# Patient Record
Sex: Female | Born: 1970 | Race: Black or African American | Hispanic: No | Marital: Married | Smoking: Never smoker
Health system: Southern US, Community
[De-identification: ages and names within clinical notes are randomized; demographics above are authoritative.]

## PROBLEM LIST (undated history)

## (undated) DIAGNOSIS — I10 Essential (primary) hypertension: Secondary | ICD-10-CM

## (undated) DIAGNOSIS — K219 Gastro-esophageal reflux disease without esophagitis: Secondary | ICD-10-CM

## (undated) DIAGNOSIS — K08109 Complete loss of teeth, unspecified cause, unspecified class: Secondary | ICD-10-CM

## (undated) DIAGNOSIS — G4730 Sleep apnea, unspecified: Secondary | ICD-10-CM

## (undated) DIAGNOSIS — M4316 Spondylolisthesis, lumbar region: Secondary | ICD-10-CM

## (undated) DIAGNOSIS — M1990 Unspecified osteoarthritis, unspecified site: Secondary | ICD-10-CM

## (undated) DIAGNOSIS — Z973 Presence of spectacles and contact lenses: Secondary | ICD-10-CM

## (undated) HISTORY — PX: CARPAL TUNNEL RELEASE: SHX101

## (undated) HISTORY — PX: OTHER SURGICAL HISTORY: SHX169

## (undated) HISTORY — PX: TUBAL LIGATION: SHX77

## (undated) HISTORY — PX: MULTIPLE TOOTH EXTRACTIONS: SHX2053

---

## 2012-08-15 ENCOUNTER — Emergency Department (HOSPITAL_COMMUNITY)
Admission: EM | Admit: 2012-08-15 | Discharge: 2012-08-15 | Disposition: A | Discharge status: Home / Self Care | Payer: BC Managed Care – PPO | Attending: Emergency Medicine | Admitting: Emergency Medicine

## 2012-08-15 ENCOUNTER — Emergency Department (HOSPITAL_COMMUNITY): Payer: BC Managed Care – PPO

## 2012-08-15 ENCOUNTER — Encounter (HOSPITAL_COMMUNITY): Payer: Self-pay | Admitting: *Deleted

## 2012-08-15 DIAGNOSIS — I10 Essential (primary) hypertension: Secondary | ICD-10-CM | POA: Insufficient documentation

## 2012-08-15 DIAGNOSIS — J40 Bronchitis, not specified as acute or chronic: Secondary | ICD-10-CM

## 2012-08-15 DIAGNOSIS — Z79899 Other long term (current) drug therapy: Secondary | ICD-10-CM | POA: Insufficient documentation

## 2012-08-15 HISTORY — DX: Essential (primary) hypertension: I10

## 2012-08-15 MED ORDER — PREDNISONE (PAK) 10 MG PO TABS: 10.0000 mg | ORAL_TABLET | Freq: Every day | ORAL | Status: DC

## 2012-08-15 MED ORDER — ALBUTEROL SULFATE HFA 108 (90 BASE) MCG/ACT IN AERS: 2.0000 | INHALATION_SPRAY | RESPIRATORY_TRACT | Status: DC | PRN

## 2012-08-15 MED ORDER — ALBUTEROL SULFATE (5 MG/ML) 0.5% IN NEBU
2.5000 mg | INHALATION_SOLUTION | Freq: Once | RESPIRATORY_TRACT | Status: AC
  Administered 2012-08-15: 2.5 mg via RESPIRATORY_TRACT
  Filled 2012-08-15: qty 0.5

## 2012-08-15 MED ORDER — IPRATROPIUM BROMIDE 0.02 % IN SOLN
0.5000 mg | Freq: Once | RESPIRATORY_TRACT | Status: AC
  Administered 2012-08-15: 0.5 mg via RESPIRATORY_TRACT
  Filled 2012-08-15: qty 2.5

## 2012-08-15 NOTE — ED Notes (Signed)
Cough, feels sob at times.  Seen at Urgent care in [place] and dx with bronchitis and pharngitis,  3/17, started on z pack .  Feels no better.  No fever

## 2012-08-15 NOTE — ED Provider Notes (Signed)
History     CSN: [id number]  Arrival date & time 08/15/12  1046   First MD Initiated Contact with Patient 08/15/12 1124      Chief Complaint  Patient presents with  . Cough    (Consider location/radiation/quality/duration/timing/severity/associated sxs/prior treatment) Patient is a 42 y.o. female presenting with cough. The history is provided by the patient.  Cough Cough characteristics:  Non-productive and hacking Severity:  Moderate Onset quality:  Gradual Smoker: no   Relieved by:  Nothing  Patient went to Urgent Care 08/12/12 and was started on Z-pak and cough medication and had an injection. She continues to have a cough.   Past Medical History  Diagnosis Date  . Hypertension     Past Surgical History  Procedure Laterality Date  . Carpal tunnel release    . Tubal ligation      History reviewed. No pertinent family history.  History  Substance Use Topics  . Smoking status: Never Smoker   . Smokeless tobacco: Not on file  . Alcohol Use: No    OB History   Grav Para Term Preterm Abortions TAB SAB Ect Mult Living                  Review of Systems  Respiratory: Positive for cough.     Allergies  Review of patient's allergies indicates no known allergies.  Home Medications   Current Outpatient Rx  Name  Route  Sig  Dispense  Refill  . metoprolol succinate (TOPROL-XL) 50 MG 24 hr tablet   Oral   Take 50 mg by mouth every morning. Take with or immediately following a meal.         . omeprazole (PRILOSEC) 40 MG capsule   Oral   Take 40 mg by mouth daily.           BP 138/84  Pulse 92  Temp(Src) 98.3 F (36.8 C) (Oral)  Resp 20  Ht 5' (1.524 m)  Wt 194 lb (87.998 kg)  BMI 37.89 kg/m2  SpO2 100%  LMP 07/05/2012  Physical Exam  Nursing note and vitals reviewed. Constitutional: She is oriented to person, place, and time. She appears well-developed and well-nourished. No distress.  HENT:  Head: Normocephalic and atraumatic.  Eyes: EOM  are normal. Pupils are equal, round, and reactive to light.  Neck: Neck supple.  Cardiovascular: Normal rate and regular rhythm.   Pulmonary/Chest: Effort normal.  Decreased breath sounds bilateral lower lungs. Prolonged expirations.  Abdominal: Soft. There is no tenderness.  Musculoskeletal: Normal range of motion. She exhibits no edema.  Neurological: She is alert and oriented to person, place, and time. No cranial nerve deficit.  Skin: Skin is warm and dry.  Psychiatric: She has a normal mood and affect. Her behavior is normal. Judgment and thought content normal.   Dg Chest 2 View  08/15/2012  *RADIOLOGY REPORT*  Clinical Data: Cough.  CHEST - 2 VIEW  Comparison: None.  Findings: Heart is upper normal in size.  Low volumes.  Bibasilar atelectasis.  No consolidation.  No pneumothorax and no pleural effusion.  IMPRESSION: Bibasilar atelectasis.   Original Report Authenticated By: Jolaine Click, M.D.     Assessment: 42 y.o. female with cough   Bronchitis  Plan:  Continue cough medication   Continue Z-pak   Albuterol inhaler   Prednisone taper   Follow up with PCP, return as needed   ED Course  Procedures   MDM  I have reviewed this patient's vital signs, nurses  notes, appropriate imaging.  I have discussed findings with the patient and plan of care. Patient voices understanding.   Medication List    TAKE these medications       albuterol 108 (90 BASE) MCG/ACT inhaler  Commonly known as:  PROVENTIL HFA;VENTOLIN HFA  Inhale 2 puffs into the lungs every 4 (four) hours as needed for wheezing.     predniSONE 10 MG tablet  Commonly known as:  STERAPRED UNI-PAK  Take 1 tablet (10 mg total) by mouth daily. Take 6 tablets today, then 5 tablets tomorrow, then,4,3,2,1      ASK your doctor about these medications       metoprolol succinate 50 MG 24 hr tablet  Commonly known as:  TOPROL-XL  Take 50 mg by mouth every morning. Take with or immediately following a meal.     omeprazole  40 MG capsule  Commonly known as:  PRILOSEC  Take 40 mg by mouth daily.              [place], [place] 08/16/12 [phone]

## 2012-08-19 NOTE — ED Provider Notes (Signed)
Medical screening examination/treatment/procedure(s) were performed by non-physician practitioner and as supervising physician I was immediately available for consultation/collaboration.   Shelda Jakes, MD 08/19/12 [phone]

## 2018-04-11 HISTORY — PX: OTHER SURGICAL HISTORY: SHX169

## 2019-01-21 ENCOUNTER — Other Ambulatory Visit: Payer: Self-pay | Admitting: Neurological Surgery

## 2019-01-30 ENCOUNTER — Other Ambulatory Visit: Payer: Self-pay

## 2019-01-30 ENCOUNTER — Encounter (HOSPITAL_COMMUNITY): Payer: Self-pay | Admitting: *Deleted

## 2019-01-30 NOTE — Progress Notes (Addendum)
Sherry Crane denies chest pain or shortness of breath.  Patient reports that a couple of days ago she had pain in her left arm, patient rates pain a 5-6, it was a sharpe ache. Sherry Crane states that she had not injured her arm,she lied down and it went away.  "You know what they say about left arm pain can be a symptom of heart attack, if it had not gone away I would have called someone." Sherry Crane has a history of HTN, Sleep apnea, she has not had any cardiac studies.  PCP is Sherry Crane at [facility].  Patient has sleep apnea, the Dr who did the test retired and " I am not wearing it now, because I can't get my supplies."  Sherry Crane and daughters and a cousin, tested positive for Covid in August, possibly around the 7th or 8th, they were on quarantine until 01/18/2019. Patient was tested [facility] in [place].    I am requesting records from PCP and [facility]. I am asking anesthesia PA- C to review chart.

## 2019-01-31 ENCOUNTER — Encounter (HOSPITAL_COMMUNITY): Payer: Self-pay | Admitting: Physician Assistant

## 2019-01-31 NOTE — Anesthesia Preprocedure Evaluation (Deleted)
Anesthesia Evaluation    Airway        Dental   Pulmonary           Cardiovascular hypertension,      Neuro/Psych    GI/Hepatic   Endo/Other    Renal/GU      Musculoskeletal   Abdominal   Peds  Hematology   Anesthesia Other Findings   Reproductive/Obstetrics                             Anesthesia Physical Anesthesia Plan  ASA:   Anesthesia Plan:    Post-op Pain Management:    Induction:   PONV Risk Score and Plan:   Airway Management Planned:   Additional Equipment:   Intra-op Plan:   Post-operative Plan:   Informed Consent:   Plan Discussed with:   Anesthesia Plan Comments: (Pt reported to PAT nurse during SDW call that she had recently had throbbing discomfort in her left arm and if the pain continued she would get seen at ED in case it was heart related. Review of records in care everywhere shows she was seen by her PCP Dr. Garwin Brothers Oct 2019 for c/o of chest tightness. EKG was normal and symptoms resolved but per note they would consider cardiology referral if symptoms continued. I called and spoke with Dr. Garwin Brothers and she said the patient was no longer seen in her clinic due to noncompliance. Pt stated she currently has no PCP. I called and spoke with Janett Billow at Dr. Colleen Can office to let her know that pt will at minimum need to be seen and cleared by PCP if she has one and if not she will need to establish with one.)        Anesthesia Quick Evaluation

## 2019-02-01 ENCOUNTER — Other Ambulatory Visit (HOSPITAL_COMMUNITY)
Admission: RE | Admit: 2019-02-01 | Discharge: 2019-02-01 | Disposition: A | Discharge status: Home / Self Care | Payer: BC Managed Care – PPO | Source: Ambulatory Visit | Attending: Neurological Surgery | Admitting: Neurological Surgery

## 2019-02-01 DIAGNOSIS — Z01812 Encounter for preprocedural laboratory examination: Secondary | ICD-10-CM | POA: Diagnosis present

## 2019-02-01 DIAGNOSIS — U071 COVID-19: Secondary | ICD-10-CM | POA: Diagnosis not present

## 2019-02-03 LAB — NOVEL CORONAVIRUS, NAA (HOSP ORDER, SEND-OUT TO REF LAB; TAT 18-24 HRS): SARS-CoV-2, NAA: DETECTED — AB

## 2019-02-03 NOTE — Progress Notes (Signed)
Patient has positive covid test on 02/01/19.  Dr Zada Finders called and informed of positive test results.  MD to inform patient.

## 2019-02-04 ENCOUNTER — Other Ambulatory Visit: Payer: Self-pay | Admitting: Neurological Surgery

## 2019-02-05 ENCOUNTER — Inpatient Hospital Stay (HOSPITAL_COMMUNITY)
Admission: RE | Admit: 2019-02-05 | Payer: BC Managed Care – PPO | Source: Home / Self Care | Admitting: Neurological Surgery

## 2019-02-05 HISTORY — DX: Unspecified osteoarthritis, unspecified site: M19.90

## 2019-02-05 HISTORY — DX: Gastro-esophageal reflux disease without esophagitis: K21.9

## 2019-02-05 SURGERY — TRANSFORAMINAL LUMBAR INTERBODY FUSION (TLIF) WITH PEDICLE SCREW FIXATION 1 LEVEL
Anesthesia: General

## 2019-02-13 ENCOUNTER — Encounter (HOSPITAL_COMMUNITY): Payer: Self-pay

## 2019-02-13 ENCOUNTER — Encounter (HOSPITAL_COMMUNITY)
Admission: RE | Admit: 2019-02-13 | Discharge: 2019-02-13 | Disposition: A | Discharge status: Home / Self Care | Payer: BC Managed Care – PPO | Source: Ambulatory Visit | Attending: Neurological Surgery | Admitting: Neurological Surgery

## 2019-02-13 ENCOUNTER — Other Ambulatory Visit: Payer: Self-pay

## 2019-02-13 DIAGNOSIS — R9431 Abnormal electrocardiogram [ECG] [EKG]: Secondary | ICD-10-CM | POA: Diagnosis not present

## 2019-02-13 DIAGNOSIS — Z0184 Encounter for antibody response examination: Secondary | ICD-10-CM | POA: Insufficient documentation

## 2019-02-13 DIAGNOSIS — Z01812 Encounter for preprocedural laboratory examination: Secondary | ICD-10-CM | POA: Insufficient documentation

## 2019-02-13 DIAGNOSIS — I10 Essential (primary) hypertension: Secondary | ICD-10-CM | POA: Diagnosis not present

## 2019-02-13 HISTORY — DX: Spondylolisthesis, lumbar region: M43.16

## 2019-02-13 HISTORY — DX: Presence of spectacles and contact lenses: Z97.3

## 2019-02-13 HISTORY — DX: Sleep apnea, unspecified: G47.30

## 2019-02-13 HISTORY — DX: Complete loss of teeth, unspecified cause, unspecified class: K08.109

## 2019-02-13 LAB — BASIC METABOLIC PANEL
Anion gap: 10 (ref 5–15)
BUN: 8 mg/dL (ref 6–20)
CO2: 23 mmol/L (ref 22–32)
Calcium: 9.1 mg/dL (ref 8.9–10.3)
Chloride: 106 mmol/L (ref 98–111)
Creatinine, Ser: 0.82 mg/dL (ref 0.44–1.00)
GFR calc Af Amer: >60 mL/min (ref >60)
GFR calc non Af Amer: >60 mL/min (ref >60)
Glucose, Bld: 100 mg/dL — ABNORMAL HIGH (ref 70–99)
Potassium: 3.8 mmol/L (ref 3.5–5.1)
Sodium: 139 mmol/L (ref 135–145)

## 2019-02-13 LAB — CBC
HCT: 44.3 % (ref 36.0–46.0)
Hemoglobin: 15 g/dL (ref 12.0–15.0)
MCH: 33.7 pg (ref 26.0–34.0)
MCHC: 33.9 g/dL (ref 30.0–36.0)
MCV: 99.6 fL (ref 80.0–100.0)
Platelets: 225 10*3/uL (ref 150–400)
RBC: 4.45 MIL/uL (ref 3.87–5.11)
RDW: 12 % (ref 11.5–15.5)
WBC: 7.1 10*3/uL (ref 4.0–10.5)
nRBC: 0 % (ref 0.0–0.2)

## 2019-02-13 LAB — TYPE AND SCREEN
ABO/RH(D): O POS
Antibody Screen: NEGATIVE

## 2019-02-13 LAB — SURGICAL PCR SCREEN
MRSA, PCR: NEGATIVE
Staphylococcus aureus: NEGATIVE

## 2019-02-13 LAB — ABO/RH: ABO/RH(D): O POS

## 2019-02-13 NOTE — Progress Notes (Signed)
Pt denies SOB, chest pain, and being under the care of a cardiologist. Pt PCP is Dr. Nicanor Bake. Pt denies having a stress test, echo and cardiac cath. Pt denies having an EKG and chest x ray in the last year. Pt denies recent labs. Pt verbalized understanding of all pre-op instructions.

## 2019-02-13 NOTE — Pre-Procedure Instructions (Signed)
   Sherry Crane  02/13/2019     [id number] [place] [address] [place] [address] Phone: [phone] Fax: [phone]   Your procedure is scheduled on Wednesday, February 19, 2019  Report to [facility] Admitting at 10:30 A.M.  Call this number if you have problems the morning of surgery:  [phone]   Remember: Brush your teeth the morning of surgery with your regular toothpaste.  Do not eat or drink after midnight Tuesday, February 18, 2019   Take these medicines the morning of surgery with A SIP OF WATER : amLODipine (NORVASC),   metoprolol succinate (TOPROL-XL), omeprazole (PRILOSEC)  If needed: traMADol (ULTRAM) for pain. Stop taking Aspirin (unless otherwise advised by surgeon), vitamins, fish oil and herbal medications. Do not take any NSAIDs ie: Ibuprofen, Advil, Naproxen (Aleve), Motrin, BC and Goody Powder; stop now.   Do not wear jewelry, make-up or nail polish.  Do not wear lotions, powders, or perfumes, or deodorant.  Do not shave 48 hours prior to surgery.   Do not bring valuables to the hospital.  [facility] is not responsible for any belongings or valuables.  Contacts, dentures or bridgework may not be worn into surgery.  Leave your suitcase in the car.  After surgery it may be brought to your room. Patients discharged the day of surgery will not be allowed to drive home.  Special instructions: See "  [facility] Preparing For Surgery " sheet.  Please read over the following fact sheets that you were given. Pain Booklet, Coughing and Deep Breathing, Total Joint Packet, MRSA Information and Surgical Site Infection Prevention

## 2019-02-19 ENCOUNTER — Encounter (HOSPITAL_COMMUNITY)
Admission: RE | Disposition: A | Discharge status: Home Health | Payer: Self-pay | Source: Home / Self Care | Attending: Neurological Surgery

## 2019-02-19 ENCOUNTER — Inpatient Hospital Stay (HOSPITAL_COMMUNITY)
Admission: RE | Admit: 2019-02-19 | Discharge: 2019-02-20 | DRG: 460 | Disposition: A | Discharge status: Home Health | Payer: BC Managed Care – PPO | Attending: Neurological Surgery | Admitting: Neurological Surgery

## 2019-02-19 ENCOUNTER — Other Ambulatory Visit: Payer: Self-pay

## 2019-02-19 ENCOUNTER — Inpatient Hospital Stay (HOSPITAL_COMMUNITY): Payer: BC Managed Care – PPO | Admitting: Certified Registered"

## 2019-02-19 ENCOUNTER — Encounter (HOSPITAL_COMMUNITY): Payer: Self-pay

## 2019-02-19 ENCOUNTER — Inpatient Hospital Stay (HOSPITAL_COMMUNITY): Payer: BC Managed Care – PPO

## 2019-02-19 DIAGNOSIS — Z833 Family history of diabetes mellitus: Secondary | ICD-10-CM

## 2019-02-19 DIAGNOSIS — M5416 Radiculopathy, lumbar region: Principal | ICD-10-CM | POA: Diagnosis present

## 2019-02-19 DIAGNOSIS — K219 Gastro-esophageal reflux disease without esophagitis: Secondary | ICD-10-CM | POA: Diagnosis present

## 2019-02-19 DIAGNOSIS — Z8249 Family history of ischemic heart disease and other diseases of the circulatory system: Secondary | ICD-10-CM

## 2019-02-19 DIAGNOSIS — Z20828 Contact with and (suspected) exposure to other viral communicable diseases: Secondary | ICD-10-CM | POA: Diagnosis present

## 2019-02-19 DIAGNOSIS — M4316 Spondylolisthesis, lumbar region: Secondary | ICD-10-CM | POA: Diagnosis present

## 2019-02-19 DIAGNOSIS — I10 Essential (primary) hypertension: Secondary | ICD-10-CM | POA: Diagnosis present

## 2019-02-19 DIAGNOSIS — G4730 Sleep apnea, unspecified: Secondary | ICD-10-CM | POA: Diagnosis present

## 2019-02-19 DIAGNOSIS — M4807 Spinal stenosis, lumbosacral region: Secondary | ICD-10-CM | POA: Diagnosis present

## 2019-02-19 DIAGNOSIS — Z419 Encounter for procedure for purposes other than remedying health state, unspecified: Secondary | ICD-10-CM

## 2019-02-19 HISTORY — PX: TRANSFORAMINAL LUMBAR INTERBODY FUSION (TLIF) WITH PEDICLE SCREW FIXATION 1 LEVEL: SHX6141

## 2019-02-19 LAB — GLUCOSE, CAPILLARY: Glucose-Capillary: 171 mg/dL — ABNORMAL HIGH (ref 70–99)

## 2019-02-19 LAB — POCT PREGNANCY, URINE: Preg Test, Ur: NEGATIVE

## 2019-02-19 SURGERY — TRANSFORAMINAL LUMBAR INTERBODY FUSION (TLIF) WITH PEDICLE SCREW FIXATION 1 LEVEL
Anesthesia: General | Site: Spine Lumbar

## 2019-02-19 MED ORDER — CHLORHEXIDINE GLUCONATE CLOTH 2 % EX PADS: 6.0000 | MEDICATED_PAD | Freq: Once | CUTANEOUS | Status: DC

## 2019-02-19 MED ORDER — METOPROLOL SUCCINATE ER 25 MG PO TB24
50.0000 mg | ORAL_TABLET | Freq: Every morning | ORAL | Status: DC
  Administered 2019-02-20: 09:00:00 50 mg via ORAL
  Filled 2019-02-19: qty 2

## 2019-02-19 MED ORDER — CEFAZOLIN SODIUM-DEXTROSE 2-4 GM/100ML-% IV SOLN
2.0000 g | INTRAVENOUS | Status: AC
  Administered 2019-02-19 (×2): 2 g via INTRAVENOUS
  Filled 2019-02-19: qty 100

## 2019-02-19 MED ORDER — LIDOCAINE-EPINEPHRINE 1 %-1:100000 IJ SOLN
INTRAMUSCULAR | Status: AC
  Filled 2019-02-19: qty 1

## 2019-02-19 MED ORDER — ACETAMINOPHEN 650 MG RE SUPP: 650.0000 mg | RECTAL | Status: DC | PRN

## 2019-02-19 MED ORDER — PROPOFOL 10 MG/ML IV BOLUS
INTRAVENOUS | Status: DC | PRN
  Administered 2019-02-19: 190 mg via INTRAVENOUS

## 2019-02-19 MED ORDER — OXYCODONE HCL 5 MG PO TABS: 5.0000 mg | ORAL_TABLET | Freq: Once | ORAL | Status: DC | PRN

## 2019-02-19 MED ORDER — BUPROPION HCL ER (XL) 150 MG PO TB24
150.0000 mg | ORAL_TABLET | Freq: Every day | ORAL | Status: DC
  Administered 2019-02-19: 21:00:00 150 mg via ORAL
  Filled 2019-02-19: qty 1

## 2019-02-19 MED ORDER — DEXAMETHASONE SODIUM PHOSPHATE 10 MG/ML IJ SOLN
INTRAMUSCULAR | Status: DC | PRN
  Administered 2019-02-19: 10 mg via INTRAVENOUS

## 2019-02-19 MED ORDER — DOCUSATE SODIUM 100 MG PO CAPS
100.0000 mg | ORAL_CAPSULE | Freq: Two times a day (BID) | ORAL | Status: DC
  Administered 2019-02-19 – 2019-02-20 (×2): 100 mg via ORAL
  Filled 2019-02-19 (×2): qty 1

## 2019-02-19 MED ORDER — ONDANSETRON HCL 4 MG/2ML IJ SOLN
INTRAMUSCULAR | Status: DC | PRN
  Administered 2019-02-19: 4 mg via INTRAVENOUS

## 2019-02-19 MED ORDER — ONDANSETRON HCL 4 MG/2ML IJ SOLN
INTRAMUSCULAR | Status: AC
  Filled 2019-02-19: qty 2

## 2019-02-19 MED ORDER — PROPOFOL 10 MG/ML IV BOLUS
INTRAVENOUS | Status: AC
  Filled 2019-02-19: qty 20

## 2019-02-19 MED ORDER — HYDROMORPHONE HCL 1 MG/ML IJ SOLN: 0.5000 mg | INTRAMUSCULAR | Status: DC | PRN

## 2019-02-19 MED ORDER — PANTOPRAZOLE SODIUM 40 MG PO TBEC
40.0000 mg | DELAYED_RELEASE_TABLET | Freq: Every evening | ORAL | Status: DC
  Administered 2019-02-19: 40 mg via ORAL
  Filled 2019-02-19: qty 1

## 2019-02-19 MED ORDER — POLYETHYLENE GLYCOL 3350 17 G PO PACK: 17.0000 g | PACK | Freq: Every day | ORAL | Status: DC | PRN

## 2019-02-19 MED ORDER — EPHEDRINE 5 MG/ML INJ
INTRAVENOUS | Status: AC
  Filled 2019-02-19: qty 10

## 2019-02-19 MED ORDER — THROMBIN 5000 UNITS EX SOLR
CUTANEOUS | Status: AC
  Filled 2019-02-19: qty 5000

## 2019-02-19 MED ORDER — ACETAMINOPHEN 325 MG PO TABS
650.0000 mg | ORAL_TABLET | ORAL | Status: DC | PRN
  Administered 2019-02-19 – 2019-02-20 (×2): 650 mg via ORAL
  Filled 2019-02-19 (×2): qty 2

## 2019-02-19 MED ORDER — ONDANSETRON HCL 4 MG/2ML IJ SOLN: 4.0000 mg | Freq: Four times a day (QID) | INTRAMUSCULAR | Status: DC | PRN

## 2019-02-19 MED ORDER — ONDANSETRON HCL 4 MG/2ML IJ SOLN
4.0000 mg | Freq: Four times a day (QID) | INTRAMUSCULAR | Status: DC | PRN
  Administered 2019-02-19: 21:00:00 4 mg via INTRAVENOUS
  Filled 2019-02-19: qty 2

## 2019-02-19 MED ORDER — OXYCODONE HCL 5 MG PO TABS
10.0000 mg | ORAL_TABLET | ORAL | Status: DC | PRN
  Administered 2019-02-20: 10 mg via ORAL
  Filled 2019-02-19: qty 2

## 2019-02-19 MED ORDER — 0.9 % SODIUM CHLORIDE (POUR BTL) OPTIME
TOPICAL | Status: DC | PRN
  Administered 2019-02-19: 1000 mL

## 2019-02-19 MED ORDER — SODIUM CHLORIDE 0.9% FLUSH: 3.0000 mL | INTRAVENOUS | Status: DC | PRN

## 2019-02-19 MED ORDER — PHENOL 1.4 % MT LIQD: 1.0000 | OROMUCOSAL | Status: DC | PRN

## 2019-02-19 MED ORDER — FENTANYL CITRATE (PF) 100 MCG/2ML IJ SOLN
INTRAMUSCULAR | Status: DC | PRN
  Administered 2019-02-19: 150 ug via INTRAVENOUS
  Administered 2019-02-19: 100 ug via INTRAVENOUS
  Administered 2019-02-19 (×4): 50 ug via INTRAVENOUS
  Administered 2019-02-19: 100 ug via INTRAVENOUS

## 2019-02-19 MED ORDER — LIDOCAINE 2% (20 MG/ML) 5 ML SYRINGE
INTRAMUSCULAR | Status: AC
  Filled 2019-02-19: qty 5

## 2019-02-19 MED ORDER — LACTATED RINGERS IV SOLN
INTRAVENOUS | Status: DC
  Administered 2019-02-19 (×3): via INTRAVENOUS

## 2019-02-19 MED ORDER — LIDOCAINE-EPINEPHRINE 1 %-1:100000 IJ SOLN
INTRAMUSCULAR | Status: DC | PRN
  Administered 2019-02-19: 3.5 mL

## 2019-02-19 MED ORDER — OXYCODONE HCL 5 MG/5ML PO SOLN: 5.0000 mg | Freq: Once | ORAL | Status: DC | PRN

## 2019-02-19 MED ORDER — ROCURONIUM BROMIDE 10 MG/ML (PF) SYRINGE
PREFILLED_SYRINGE | INTRAVENOUS | Status: AC
  Filled 2019-02-19: qty 10

## 2019-02-19 MED ORDER — SODIUM CHLORIDE 0.9 % IV SOLN: 250.0000 mL | INTRAVENOUS | Status: DC

## 2019-02-19 MED ORDER — MIDAZOLAM HCL 5 MG/5ML IJ SOLN
INTRAMUSCULAR | Status: DC | PRN
  Administered 2019-02-19: 2 mg via INTRAVENOUS

## 2019-02-19 MED ORDER — BUPIVACAINE HCL (PF) 0.5 % IJ SOLN
INTRAMUSCULAR | Status: AC
  Filled 2019-02-19: qty 30

## 2019-02-19 MED ORDER — PHENYLEPHRINE 40 MCG/ML (10ML) SYRINGE FOR IV PUSH (FOR BLOOD PRESSURE SUPPORT)
PREFILLED_SYRINGE | INTRAVENOUS | Status: DC | PRN
  Administered 2019-02-19: 80 ug via INTRAVENOUS
  Administered 2019-02-19: 160 ug via INTRAVENOUS

## 2019-02-19 MED ORDER — FENTANYL CITRATE (PF) 250 MCG/5ML IJ SOLN
INTRAMUSCULAR | Status: AC
  Filled 2019-02-19: qty 5

## 2019-02-19 MED ORDER — LIDOCAINE 2% (20 MG/ML) 5 ML SYRINGE
INTRAMUSCULAR | Status: DC | PRN
  Administered 2019-02-19: 60 mg via INTRAVENOUS

## 2019-02-19 MED ORDER — ROCURONIUM BROMIDE 50 MG/5ML IV SOSY
PREFILLED_SYRINGE | INTRAVENOUS | Status: DC | PRN
  Administered 2019-02-19 (×2): 50 mg via INTRAVENOUS

## 2019-02-19 MED ORDER — CEFAZOLIN SODIUM 1 G IJ SOLR
INTRAMUSCULAR | Status: AC
  Filled 2019-02-19: qty 20

## 2019-02-19 MED ORDER — AMLODIPINE BESYLATE 5 MG PO TABS
5.0000 mg | ORAL_TABLET | Freq: Every day | ORAL | Status: DC
  Administered 2019-02-20: 09:00:00 5 mg via ORAL
  Filled 2019-02-19: qty 1

## 2019-02-19 MED ORDER — SODIUM CHLORIDE 0.9% FLUSH
3.0000 mL | Freq: Two times a day (BID) | INTRAVENOUS | Status: DC
  Administered 2019-02-19 – 2019-02-20 (×2): 3 mL via INTRAVENOUS

## 2019-02-19 MED ORDER — THROMBIN 5000 UNITS EX SOLR
OROMUCOSAL | Status: DC | PRN
  Administered 2019-02-19: 5 mL via TOPICAL

## 2019-02-19 MED ORDER — SUGAMMADEX SODIUM 200 MG/2ML IV SOLN
INTRAVENOUS | Status: DC | PRN
  Administered 2019-02-19: 200 mg via INTRAVENOUS

## 2019-02-19 MED ORDER — CEFAZOLIN SODIUM-DEXTROSE 2-4 GM/100ML-% IV SOLN
2.0000 g | Freq: Three times a day (TID) | INTRAVENOUS | Status: AC
  Administered 2019-02-20 (×2): 2 g via INTRAVENOUS
  Filled 2019-02-19 (×2): qty 100

## 2019-02-19 MED ORDER — BUPIVACAINE HCL (PF) 0.5 % IJ SOLN
INTRAMUSCULAR | Status: DC | PRN
  Administered 2019-02-19: 3.5 mL

## 2019-02-19 MED ORDER — ONDANSETRON HCL 4 MG PO TABS: 4.0000 mg | ORAL_TABLET | Freq: Four times a day (QID) | ORAL | Status: DC | PRN

## 2019-02-19 MED ORDER — DEXAMETHASONE SODIUM PHOSPHATE 10 MG/ML IJ SOLN
INTRAMUSCULAR | Status: AC
  Filled 2019-02-19: qty 1

## 2019-02-19 MED ORDER — SODIUM CHLORIDE 0.9 % IV SOLN
INTRAVENOUS | Status: DC | PRN
  Administered 2019-02-19: 50 ug/min via INTRAVENOUS

## 2019-02-19 MED ORDER — OXYCODONE HCL 5 MG PO TABS
5.0000 mg | ORAL_TABLET | ORAL | Status: DC | PRN
  Administered 2019-02-20 (×2): 5 mg via ORAL
  Filled 2019-02-19 (×2): qty 1

## 2019-02-19 MED ORDER — MENTHOL 3 MG MT LOZG: 1.0000 | LOZENGE | OROMUCOSAL | Status: DC | PRN

## 2019-02-19 MED ORDER — FENTANYL CITRATE (PF) 100 MCG/2ML IJ SOLN: 25.0000 ug | INTRAMUSCULAR | Status: DC | PRN

## 2019-02-19 MED ORDER — MIDAZOLAM HCL 2 MG/2ML IJ SOLN
INTRAMUSCULAR | Status: AC
  Filled 2019-02-19: qty 2

## 2019-02-19 MED ORDER — PHENYLEPHRINE 40 MCG/ML (10ML) SYRINGE FOR IV PUSH (FOR BLOOD PRESSURE SUPPORT)
PREFILLED_SYRINGE | INTRAVENOUS | Status: AC
  Filled 2019-02-19: qty 10

## 2019-02-19 MED ORDER — SODIUM CHLORIDE 0.9 % IV SOLN
INTRAVENOUS | Status: DC | PRN
  Administered 2019-02-19: 14:00:00 500 mL

## 2019-02-19 SURGICAL SUPPLY — 69 items
BAG DECANTER FOR FLEXI CONT (MISCELLANEOUS) ×3 IMPLANT
BAND RUBBER #18 3X1/16 STRL (MISCELLANEOUS) IMPLANT
BASKET BONE COLLECTION (BASKET) ×3 IMPLANT
BLADE SURG 11 STRL SS (BLADE) ×3 IMPLANT
BUR MATCHSTICK NEURO 3.0 LAGG (BURR) ×3 IMPLANT
BUR PRECISION FLUTE 5.0 (BURR) ×3 IMPLANT
CANISTER SUCT 3000ML PPV (MISCELLANEOUS) ×3 IMPLANT
CONT SPEC 4OZ CLIKSEAL STRL BL (MISCELLANEOUS) ×3 IMPLANT
COVER BACK TABLE 60X90IN (DRAPES) ×3 IMPLANT
DECANTER SPIKE VIAL GLASS SM (MISCELLANEOUS) ×3 IMPLANT
DERMABOND ADVANCED (GAUZE/BANDAGES/DRESSINGS) ×2
DERMABOND ADVANCED .7 DNX12 (GAUZE/BANDAGES/DRESSINGS) ×1 IMPLANT
DEVICE INTERBODY ELEVATE 23X7 (Cage) ×3 IMPLANT
DRAPE C-ARM 42X72 X-RAY (DRAPES) ×3 IMPLANT
DRAPE C-ARMOR (DRAPES) ×3 IMPLANT
DRAPE LAPAROTOMY 100X72X124 (DRAPES) ×3 IMPLANT
DRAPE MICROSCOPE LEICA (MISCELLANEOUS) IMPLANT
DRAPE SURG 17X23 STRL (DRAPES) IMPLANT
DRSG OPSITE POSTOP 4X6 (GAUZE/BANDAGES/DRESSINGS) ×3 IMPLANT
DURAPREP 26ML APPLICATOR (WOUND CARE) ×3 IMPLANT
ELECT BLADE 4.0 EZ CLEAN MEGAD (MISCELLANEOUS) ×3
ELECT REM PT RETURN 9FT ADLT (ELECTROSURGICAL) ×3
ELECTRODE BLDE 4.0 EZ CLN MEGD (MISCELLANEOUS) ×1 IMPLANT
ELECTRODE REM PT RTRN 9FT ADLT (ELECTROSURGICAL) ×1 IMPLANT
GAUZE 4X4 16PLY RFD (DISPOSABLE) IMPLANT
GAUZE SPONGE 4X4 12PLY STRL (GAUZE/BANDAGES/DRESSINGS) IMPLANT
GLOVE BIO SURGEON STRL SZ7 (GLOVE) ×6 IMPLANT
GLOVE BIO SURGEON STRL SZ7.5 (GLOVE) ×6 IMPLANT
GLOVE BIOGEL PI IND STRL 7.5 (GLOVE) ×2 IMPLANT
GLOVE BIOGEL PI IND STRL 8 (GLOVE) ×4 IMPLANT
GLOVE BIOGEL PI INDICATOR 7.5 (GLOVE) ×4
GLOVE BIOGEL PI INDICATOR 8 (GLOVE) ×8
GLOVE ECLIPSE 7.5 STRL STRAW (GLOVE) ×9 IMPLANT
GLOVE EXAM NITRILE LRG STRL (GLOVE) IMPLANT
GLOVE EXAM NITRILE XL STR (GLOVE) IMPLANT
GLOVE EXAM NITRILE XS STR PU (GLOVE) IMPLANT
GOWN STRL REUS W/ TWL LRG LVL3 (GOWN DISPOSABLE) ×2 IMPLANT
GOWN STRL REUS W/ TWL XL LVL3 (GOWN DISPOSABLE) IMPLANT
GOWN STRL REUS W/TWL 2XL LVL3 (GOWN DISPOSABLE) ×6 IMPLANT
GOWN STRL REUS W/TWL LRG LVL3 (GOWN DISPOSABLE) ×4
GOWN STRL REUS W/TWL XL LVL3 (GOWN DISPOSABLE)
HEMOSTAT POWDER KIT SURGIFOAM (HEMOSTASIS) ×3 IMPLANT
KIT BASIN OR (CUSTOM PROCEDURE TRAY) ×3 IMPLANT
KIT POSITION SURG JACKSON T1 (MISCELLANEOUS) ×3 IMPLANT
KIT TURNOVER KIT B (KITS) ×3 IMPLANT
MILL MEDIUM DISP (BLADE) IMPLANT
NEEDLE HYPO 18GX1.5 BLUNT FILL (NEEDLE) IMPLANT
NEEDLE HYPO 22GX1.5 SAFETY (NEEDLE) ×3 IMPLANT
NEEDLE SPNL 18GX3.5 QUINCKE PK (NEEDLE) IMPLANT
NS IRRIG 1000ML POUR BTL (IV SOLUTION) ×3 IMPLANT
PACK LAMINECTOMY NEURO (CUSTOM PROCEDURE TRAY) ×3 IMPLANT
PAD ARMBOARD 7.5X6 YLW CONV (MISCELLANEOUS) ×9 IMPLANT
ROD SPINAL 5.5X35 CP 4 TI (Rod) ×6 IMPLANT
SCREW SET SOLERA (Screw) ×8 IMPLANT
SCREW SET SOLERA TI5.5 (Screw) ×4 IMPLANT
SCREW SOLERA 30X6.5XMA NS SPNE (Screw) ×2 IMPLANT
SCREW SOLERA 6.5X30MM (Screw) ×4 IMPLANT
SCREW SOLERA 6.5X35MM (Screw) ×6 IMPLANT
SPONGE LAP 4X18 RFD (DISPOSABLE) IMPLANT
SPONGE SURGIFOAM ABS GEL 100 (HEMOSTASIS) IMPLANT
SUT MNCRL AB 3-0 PS2 18 (SUTURE) ×3 IMPLANT
SUT VIC AB 0 CT1 18XCR BRD8 (SUTURE) ×1 IMPLANT
SUT VIC AB 0 CT1 8-18 (SUTURE) ×2
SUT VIC AB 2-0 CP2 18 (SUTURE) ×6 IMPLANT
SYR 3ML LL SCALE MARK (SYRINGE) IMPLANT
TOWEL GREEN STERILE (TOWEL DISPOSABLE) ×3 IMPLANT
TOWEL GREEN STERILE FF (TOWEL DISPOSABLE) ×3 IMPLANT
TRAY FOLEY MTR SLVR 16FR STAT (SET/KITS/TRAYS/PACK) ×3 IMPLANT
WATER STERILE IRR 1000ML POUR (IV SOLUTION) ×3 IMPLANT

## 2019-02-19 NOTE — Op Note (Addendum)
PATIENT: Sherry Crane  DAY OF SURGERY: 02/19/19   PRE-OPERATIVE DIAGNOSIS:  Lumbar spondylolisthesis, lumbar radiculopathy   POST-OPERATIVE DIAGNOSIS:  Lumbar spondylolisthesis, lumbar radiculopathy   PROCEDURE:  L5-S1 bilateral facetectomies, left L5-S1 TLIF, bilateral L5-S1 pedicle screw placement with posterolateral fusion   SURGEON:  Surgeon(s) and Role:    Judith Part, MD - Primary     ANESTHESIA: ETGA   BRIEF HISTORY: This is a 48 year old woman who presented with left greater than right lower extremity radicular pain and low back pain. The patient was found to have a mobile spondylolisthesis with left greater than right foraminal stenosis at L5-S1. This was discussed with the patient as well as risks, benefits, and alternatives and wished to proceed with surgery.   OPERATIVE DETAIL: The patient was taken to the operating room and anesthesia was induced by the anesthesia team. They were placed on the OR table in the prone position with padding of all pressure points. A formal time out was performed with two patient identifiers and confirmed the operative site. The operative site was marked, hair was clipped with surgical clippers, the area was then prepped and draped in a sterile fashion. Fluoro was used to localize the operative level and a midline incision was placed to expose from L5 to S1. Subperiosteal dissection was performed bilaterally and fluoroscopy was again used to confirm the surgical level.   Decompression was performed, which consisted of bilateral facetectomies and foraminotomies at L5-S1. The bone was saved during decompression, morselized, and used as autograft in the fusion procedure. A TLIF was then performed on the left at L5-S1. The exiting and traversing nerve roots were identified and protected along with the thecal sac. An annulotomy was created and a discectomy was performed using a combination of rongeurs, shavers, and curettes. After disc removal and  endplate preparation, bone graft was inserted into the disc space with a funnel and an expandable PEEK interbody cage (Medtronic) was inserted using fluoroscopic guidance. It was expanded and final location was confirmed.   Pedicle instrumentation was then performed. Fluoroscopy was used to guide placement of bilateral pedicle screws (Medtronic) at L5 and S1. These were placed by localizing the pedicle with standard landmarks, drilling a pilot hole, cannulating the pedicle with an awl, palpating for pedicle wall breaches, tapping, palpating again, and then placing the screw. These were connected with rods bilaterally and final tightened according to manufacturer torque specifications. The bone was thoroughly decorticated over the fusion surface and the previously resected bone fragments were morselized and used as autograft.   All instrument and sponge counts were correct, the incision was then closed in layers. The patient was then returned to anesthesia for emergence. No apparent complications at the completion of the procedure.   EBL:  329mL   DRAINS: none   SPECIMENS: none   Judith Part, MD 02/19/19 1:01 PM

## 2019-02-19 NOTE — Transfer of Care (Signed)
Immediate Anesthesia Transfer of Care Note  Patient: Sherry Crane  Procedure(s) Performed: Lumbar Five Sacral One Open transforaminal lumbar interbody fusion with bilateral lumbar Five sacral Onepedicle screw placement. (N/A Spine Lumbar)  Patient Location: PACU  Anesthesia Type:General  Level of Consciousness: drowsy  Airway & Oxygen Therapy: Patient Spontanous Breathing and Patient connected to face mask oxygen  Post-op Assessment: Report given to RN, Post -op Vital signs reviewed and stable and Patient moving all extremities  Post vital signs: Reviewed and stable  Last Vitals:  Vitals Value Taken Time  BP 123/91 02/19/19 1848  Temp 36.7 C 02/19/19 1848  Pulse 91 02/19/19 1901  Resp 20 02/19/19 1901  SpO2 100 % 02/19/19 1901  Vitals shown include unvalidated device data.  Last Pain:  Vitals:   02/19/19 1848  TempSrc:   PainSc: Asleep         Complications: No apparent anesthesia complications

## 2019-02-19 NOTE — Brief Op Note (Signed)
02/19/2019  6:21 PM  PATIENT:  Sherry Crane  49 y.o. female  PRE-OPERATIVE DIAGNOSIS:  Spondylolisthesis, Lumbar region  POST-OPERATIVE DIAGNOSIS:  Spondylolisthesis, Lumbar region.  PROCEDURE:  Procedure(s) with comments: Lumbar Five Sacral One Open transforaminal lumbar interbody fusion with bilateral lumbar Five sacral Onepedicle screw placement. (N/A) - posterior  SURGEON:  Surgeon(s) and Role:    * Barbar Brede, Joyice Faster, MD - Primary  PHYSICIAN ASSISTANT:   ASSISTANTS: none   ANESTHESIA:   general  EBL:  500 mL   BLOOD ADMINISTERED:none  DRAINS: none   LOCAL MEDICATIONS USED:  LIDOCAINE   SPECIMEN:  No Specimen  DISPOSITION OF SPECIMEN:  N/A  COUNTS:  YES  TOURNIQUET:  * No tourniquets in log *  DICTATION: .Note written in EPIC  PLAN OF CARE: Admit to inpatient   PATIENT DISPOSITION:  PACU - hemodynamically stable.   Delay start of Pharmacological VTE agent (>24hrs) due to surgical blood loss or risk of bleeding: yes

## 2019-02-19 NOTE — Anesthesia Procedure Notes (Signed)
Procedure Name: Intubation Date/Time: 02/19/2019 1:09 PM Performed by: Lance Coon, CRNA Pre-anesthesia Checklist: Patient identified, Emergency Drugs available, Suction available, Patient being monitored and Timeout performed Patient Re-evaluated:Patient Re-evaluated prior to induction Oxygen Delivery Method: Circle system utilized Preoxygenation: Pre-oxygenation with 100% oxygen Induction Type: IV induction Ventilation: Mask ventilation without difficulty Laryngoscope Size: Miller and 2 Grade View: Grade I Tube type: Oral Tube size: 7.0 mm Number of attempts: 1 Airway Equipment and Method: Stylet Placement Confirmation: ETT inserted through vocal cords under direct vision,  positive ETCO2 and breath sounds checked- equal and bilateral Secured at: 21 cm Tube secured with: Tape Dental Injury: Teeth and Oropharynx as per pre-operative assessment

## 2019-02-19 NOTE — Anesthesia Preprocedure Evaluation (Signed)
Anesthesia Evaluation  Patient identified by MRN, date of birth, ID band Patient awake    Reviewed: Allergy & Precautions, H&P , NPO status , Patient's Chart, lab work & pertinent test results  Airway Mallampati: II   Neck ROM: full    Dental   Pulmonary sleep apnea ,    breath sounds clear to auscultation       Cardiovascular hypertension,  Rhythm:regular Rate:Normal     Neuro/Psych    GI/Hepatic GERD  ,  Endo/Other    Renal/GU      Musculoskeletal  (+) Arthritis ,   Abdominal   Peds  Hematology   Anesthesia Other Findings   Reproductive/Obstetrics                             Anesthesia Physical Anesthesia Plan  ASA: II  Anesthesia Plan: General   Post-op Pain Management:    Induction: Intravenous  PONV Risk Score and Plan: 3 and Ondansetron, Dexamethasone, Midazolam and Treatment may vary due to age or medical condition  Airway Management Planned: Oral ETT  Additional Equipment:   Intra-op Plan:   Post-operative Plan: Extubation in OR  Informed Consent: I have reviewed the patients History and Physical, chart, labs and discussed the procedure including the risks, benefits and alternatives for the proposed anesthesia with the patient or authorized representative who has indicated his/her understanding and acceptance.       Plan Discussed with: CRNA, Anesthesiologist and Surgeon  Anesthesia Plan Comments:         Anesthesia Quick Evaluation

## 2019-02-19 NOTE — H&P (Signed)
Surgical H&P Update  HPI: 48 y.o. woman with left greater than right lower extremity radicular pain and low back pain. MRI showed severe foraminal stenosis with spondylolisthesis, unfortunately she failed non-surgical treatment options, now here for surgery. Still having pain, numbness, and parasthesias and wishes to proceed with surgery.  PMHx:  Past Medical History:  Diagnosis Date  . Arthritis    back  . Full dentures   . GERD (gastroesophageal reflux disease)   . Hypertension   . Sleep apnea    does not wear CPAP  . Spondylolisthesis of lumbar region   . Wears glasses    FamHx:  Family History  Problem Relation Age of Onset  . Hypertension Mother   . Heart failure Mother   . Diabetes Father   . Hypertension Father   . Leukemia Sister    SocHx:  reports that she has never smoked. She has never used smokeless tobacco. She reports that she does not drink alcohol or use drugs.  Physical Exam: AOx3, PERRL, FS, TM  Strength 5/5 x4, SILTx4  Assesment/Plan: 48 y.o. woman with L5-S1 bilateral foraminal stenosis and spondylolisthesis, here for open L5-S1 TLIF with bilateral facetectomies and pedicle screw placement. Risks, benefits, and alternatives discussed and the patient would like to continue with surgery.  -OR today -3C post-op  Judith Part, MD 02/19/19 12:44 PM

## 2019-02-20 MED ORDER — CHLORHEXIDINE GLUCONATE CLOTH 2 % EX PADS
6.0000 | MEDICATED_PAD | Freq: Every day | CUTANEOUS | Status: DC
  Administered 2019-02-20: 6 via TOPICAL

## 2019-02-20 MED ORDER — OXYCODONE HCL 5 MG PO TABS: 5.0000 mg | ORAL_TABLET | ORAL | 0 refills | Status: AC | PRN

## 2019-02-20 NOTE — Progress Notes (Signed)
Arrived from PACU at 2020. Alert and oriented . Denies any pain.

## 2019-02-20 NOTE — Evaluation (Signed)
Physical Therapy Evaluation Patient Details Name: Sherry Crane MRN: [id number] DOB: 1971-05-17 Today's Date: 02/20/2019   History of Present Illness  48yo female s/p L5-S1 fusion with pedicle screw placement 02/19/19. PMH HTN, obesity  Clinical Impression  Patient received in bed, very pleasant and willing to participate in therapy this morning. Verbally reviewed spine precautions, then able to perform rolling with MinA and sidelying to sit with Min guard; otherwise performed functional transfers with min guard/RW and gait approximately 159f with RW and S. She was left up in the chair with all needs met, positioned to comfort and chair alarm active. She will continue to benefit from skilled PT services in the acute setting, also recommend skilled HHPT services moving forward.     Follow Up Recommendations Home health PT;Supervision for mobility/OOB    Equipment Recommendations  Rolling walker with 5" wheels;3in1 (PT)    Recommendations for Other Services       Precautions / Restrictions Precautions Precautions: Fall;Back Precaution Booklet Issued: No Precaution Comments: no brace needed per order set Restrictions Weight Bearing Restrictions: No      Mobility  Bed Mobility Overal bed mobility: Needs Assistance Bed Mobility: Rolling;Sidelying to Sit Rolling: Min assist Sidelying to sit: Min guard       General bed mobility comments: MinA to roll completely to side, then min guard and extended time to come up to midline sitting at EOB  Transfers Overall transfer level: Needs assistance Equipment used: Rolling walker (2 wheeled) Transfers: Sit to/from Stand Sit to Stand: Min guard         General transfer comment: extended time, cues for hand placement and sequencing  Ambulation/Gait Ambulation/Gait assistance: Supervision Gait Distance (Feet): 180 Feet Assistive device: Rolling walker (2 wheeled) Gait Pattern/deviations: Step-through pattern;Decreased step length -  right;Decreased step length - left;Decreased stride length Gait velocity: decreased   General Gait Details: gait slow but steady with RW, cues for upright posture and avoiding twisting lumbar spine with dynamic tasks  Stairs            Wheelchair Mobility    Modified Rankin (Stroke Patients Only)       Balance Overall balance assessment: Mild deficits observed, not formally tested                                           Pertinent Vitals/Pain Pain Assessment: 0-10 Pain Score: 5  Pain Location: surgical site Pain Descriptors / Indicators: Discomfort;Aching Pain Intervention(s): Limited activity within patient's tolerance;Monitored during session;Repositioned    Home Living Family/patient expects to be discharged to:: Private residence Living Arrangements: Children Available Help at Discharge: Family;Available 24 hours/day Type of Home: House Home Access: Ramped entrance     Home Layout: One level Home Equipment: None      Prior Function Level of Independence: Independent               Hand Dominance   Dominant Hand: Right    Extremity/Trunk Assessment   Upper Extremity Assessment Upper Extremity Assessment: Defer to OT evaluation    Lower Extremity Assessment Lower Extremity Assessment: Generalized weakness    Cervical / Trunk Assessment Cervical / Trunk Assessment: Normal  Communication   Communication: No difficulties  Cognition Arousal/Alertness: Awake/alert Behavior During Therapy: WFL for tasks assessed/performed Overall Cognitive Status: Within Functional Limits for tasks assessed  General Comments      Exercises     Assessment/Plan    PT Assessment Patient needs continued PT services  PT Problem List Decreased strength;Decreased mobility;Decreased safety awareness;Decreased knowledge of precautions;Obesity;Decreased balance;Pain;Decreased knowledge of use  of DME       PT Treatment Interventions DME instruction;Therapeutic activities;Gait training;Therapeutic exercise;Patient/family education;Stair training;Balance training;Functional mobility training;Neuromuscular re-education    PT Goals (Current goals can be found in the Care Plan section)  Acute Rehab PT Goals Patient Stated Goal: go home PT Goal Formulation: With patient/family Time For Goal Achievement: 03/05/19 Potential to Achieve Goals: Good    Frequency Min 5X/week   Barriers to discharge        Co-evaluation               AM-PAC PT "6 Clicks" Mobility  Outcome Measure Help needed turning from your back to your side while in a flat bed without using bedrails?: A Little Help needed moving from lying on your back to sitting on the side of a flat bed without using bedrails?: A Little Help needed moving to and from a bed to a chair (including a wheelchair)?: A Little Help needed standing up from a chair using your arms (e.g., wheelchair or bedside chair)?: A Little Help needed to walk in hospital room?: A Little Help needed climbing 3-5 steps with a railing? : A Lot 6 Click Score: 17    End of Session   Activity Tolerance: Patient tolerated treatment well Patient left: in chair;with call bell/phone within reach;with chair alarm set;with family/visitor present   PT Visit Diagnosis: Muscle weakness (generalized) (M62.81);Difficulty in walking, not elsewhere classified (R26.2);Pain Pain - Right/Left: (back pain) Pain - part of body: (back pain)    Time: 2567-2091 PT Time Calculation (min) (ACUTE ONLY): 27 min   Charges:   PT Evaluation $PT Eval Low Complexity: 1 Low PT Treatments $Gait Training: 8-22 mins        Deniece Ree PT, DPT, CBIS  Supplemental Physical Therapist [place]    Pager [phone] Acute Rehab Office [phone]

## 2019-02-20 NOTE — Progress Notes (Signed)
0630, Foley d/c with clear yellow output. H.C dressing saturated with bloody drainage and on pad  as well. New dressing applied.  Ambulated well in hallway without pain.

## 2019-02-20 NOTE — Discharge Summary (Signed)
Discharge Summary  Date of Admission: 02/19/2019  Date of Discharge: 02/20/19  Attending Physician: Emelda Brothers, MD  Hospital Course: Patient was admitted following an uncomplicated R4-B6 TLIF. She was recovered in PACU and transferred to [facility]. Her hospital course was uncomplicated and the patient was discharged home on POD1. She will follow up in clinic with me in 2 weeks.  Neurologic exam at discharge:  AOx3, PERRL, EOMI, FS, TM Strength 5/5 x4, SILTx4, no drift  Discharge diagnosis: Lumbar radiculopathy  Judith Part, MD 02/20/19 8:42 AM

## 2019-02-20 NOTE — Progress Notes (Signed)
Neurosurgery Service Progress Note  Subjective: No acute events overnight, leg pain resolved, back pain appropriate   Objective: Vitals:   02/19/19 1935 02/19/19 2018 02/20/19 0006 02/20/19 0428  BP:  112/74 122/70 99/66  Pulse:  76 92 98  Resp:  16 18 18   Temp: (!) 97 F (36.1 C) (!) 97.5 F (36.4 C) 98.4 F (36.9 C) 98.1 F (36.7 C)  TempSrc:  Oral    SpO2:  96% 100% 100%  Weight:      Height:       Temp (24hrs), Avg:97.8 F (36.6 C), Min:97 F (36.1 C), Max:98.4 F (36.9 C)  CBC Latest Ref Rng & Units 02/13/2019  WBC 4.0 - 10.5 K/uL 7.1  Hemoglobin 12.0 - 15.0 g/dL 02/15/2019  Hematocrit 78.2 - 46.0 % 44.3  Platelets 150 - 400 K/uL 225   BMP Latest Ref Rng & Units 02/13/2019  Glucose 70 - 99 mg/dL 02/15/2019)  BUN 6 - 20 mg/dL 8  Creatinine 213(Y - 8.65 mg/dL 7.84  Sodium 6.96 - 295 mmol/L 139  Potassium 3.5 - 5.1 mmol/L 3.8  Chloride 98 - 111 mmol/L 106  CO2 22 - 32 mmol/L 23  Calcium 8.9 - 10.3 mg/dL 9.1    Intake/Output Summary (Last 24 hours) at 02/20/2019 0807 Last data filed at 02/20/2019 0500 Gross per 24 hour  Intake 2600 ml  Output 1475 ml  Net 1125 ml    Current Facility-Administered Medications:  .  0.9 %  sodium chloride infusion, 250 mL, Intravenous, Continuous, Brittanya Winburn A, MD .  acetaminophen (TYLENOL) tablet 650 mg, 650 mg, Oral, Q4H PRN, 650 mg at 02/19/19 2119 **OR** acetaminophen (TYLENOL) suppository 650 mg, 650 mg, Rectal, Q4H PRN, Jerremy Maione A, MD .  amLODipine (NORVASC) tablet 5 mg, 5 mg, Oral, Daily, Cesario Weidinger A, MD .  buPROPion (WELLBUTRIN XL) 24 hr tablet 150 mg, 150 mg, Oral, QHS, 2120, MD, 150 mg at 02/19/19 2119 .  ceFAZolin (ANCEF) IVPB 2g/100 mL premix, 2 g, Intravenous, Q8H, Karam Dunson A, MD, Last Rate: 200 mL/hr at 02/20/19 0054, 2 g at 02/20/19 0054 .  Chlorhexidine Gluconate Cloth 2 % PADS 6 each, 6 each, Topical, Daily, Jermery Caratachea A, MD .  docusate sodium (COLACE) capsule 100 mg, 100  mg, Oral, BID, 02/22/19, MD, 100 mg at 02/19/19 2118 .  HYDROmorphone (DILAUDID) injection 0.5 mg, 0.5 mg, Intravenous, Q3H PRN, 2119, MD .  lactated ringers infusion, , Intravenous, Continuous, Jadene Pierini, MD, Last Rate: 10 mL/hr at 02/19/19 1100 .  menthol-cetylpyridinium (CEPACOL) lozenge 3 mg, 1 lozenge, Oral, PRN **OR** phenol (CHLORASEPTIC) mouth spray 1 spray, 1 spray, Mouth/Throat, PRN, Georganna Maxson A, MD .  metoprolol succinate (TOPROL-XL) 24 hr tablet 50 mg, 50 mg, Oral, q morning - 10a, Cinderella Christoffersen A, MD .  ondansetron (ZOFRAN) tablet 4 mg, 4 mg, Oral, Q6H PRN **OR** ondansetron (ZOFRAN) injection 4 mg, 4 mg, Intravenous, Q6H PRN, 02/21/19, MD, 4 mg at 02/19/19 2120 .  oxyCODONE (Oxy IR/ROXICODONE) immediate release tablet 10 mg, 10 mg, Oral, Q4H PRN, 2121, MD .  oxyCODONE (Oxy IR/ROXICODONE) immediate release tablet 5 mg, 5 mg, Oral, Q4H PRN, Jadene Pierini, MD, 5 mg at 02/20/19 0444 .  pantoprazole (PROTONIX) EC tablet 40 mg, 40 mg, Oral, QPM, 02/22/19, MD, 40 mg at 02/19/19 2127 .  polyethylene glycol (MIRALAX / GLYCOLAX) packet 17 g, 17 g, Oral, Daily PRN, 2128, MD .  sodium chloride flush (NS) 0.9 % injection 3 mL, 3 mL, Intravenous, Q12H, Tracey Hermance, Joyice Faster, MD, 3 mL at 02/19/19 2119 .  sodium chloride flush (NS) 0.9 % injection 3 mL, 3 mL, Intravenous, PRN, Alby Schwabe, Joyice Faster, MD   Physical Exam: AOx3, PERRL, EOMI, FS, Strength 5/5 x4, SILTx4  Assessment & Plan: 48 y.o. woman s/p L5-S1 TLIF, recovering well.  -activity as tolerated -PT/OT -SCDs/TEDs -discharge home today after PT/OT evals  Judith Part  02/20/19 8:07 AM

## 2019-02-20 NOTE — Discharge Instructions (Signed)
Discharge Instructions  No lifting more than 20 pounds, otherwise no restriction in activities, slowly increase your activity back to normal.   Your incision is closed with staples. We will remove these in clinic in 2 weeks at your follow up appointment.   Okay to shower on the day of discharge. Use regular soap and water and try to be gentle when cleaning your incision. No need for a dressing on your incision. In the first few days after surgery, there may be some bloody drainage from your wound. If this happens, you can cover your incision with a gauze dressing to prevent it from staining your clothes or bed linens. If your incision begins to itch, rub some bacitracin or neosporin ointment on it instead of scratching it.  Follow up with Dr. Zada Finders in 2 weeks after discharge. If you do not already have a discharge appointment, please call his office at [phone] to schedule a follow up appointment. If you have any concerns or questions, please call the office and let us know.

## 2019-02-20 NOTE — Progress Notes (Signed)
Pt discharge education and instructions completed with pt and daughter at bedside; both voices understanding and denies any questions. Pt discharge home with daughter to transport her home. Pt IV removed; back incision dsg stained; dsg changed with new honeycomb dsg and pressure dsg applied with abd dsg. Pt home DME walker and 3-in-1 delivered to pt at bedside; pt medicated with pain med prior to discharge. Pt transported off unit via wheelchair with belongings to the side along with daughter. Delia Heady RN

## 2019-02-20 NOTE — TOC Transition Note (Signed)
Transition of Care [facility]) - CM/SW Discharge Note   Patient Details  Name: Sherry Crane MRN: [id number] Date of Birth: 1970/08/01  [facility] of [facility]) CM/SW Contact:  Pollie Friar, RN Phone Number: 02/20/2019, 12:32 PM   Clinical Narrative:    Pt is discharging home with [facility] services through [place]. Information faxed to them at: [phone]. DME to be delivered to the room. Pt has support at home and transportation to home.    Final next level of care: Home w Home Health Services Barriers to Discharge: No Barriers Identified   Patient Goals and CMS Choice   CMS Medicare.gov Compare Post Acute Care list provided to:: Patient Choice offered to / list presented to : Patient  Discharge Placement                       Discharge Plan and Services                DME Arranged: 3-N-1, Walker rolling DME Agency: [employer] Date DME Agency Contacted: 02/20/19   Representative spoke with at DME Agency: [place]: PT [place]: Fannie Knee) Date [place]: 02/20/19      Social Determinants of Health ([place]) Interventions     Readmission Risk Interventions No flowsheet data found.

## 2019-02-20 NOTE — Evaluation (Signed)
Occupational Therapy Evaluation Patient Details Name: Sherry Crane MRN: [id number] DOB: 08-07-1970 Today's Date: 02/20/2019    History of Present Illness 48yo female s/p L5-S1 fusion with pedicle screw placement 02/19/19. PMH HTN, obesity   Clinical Impression   Pt PTA: pt living with daughter and family. Pt reports independence. Pt currently limited by decreased ability to care for self.  Pt education for LB AE and pt performing tasks with set-upA after demo. Pt supervision for standing at sink with RW. Pt ambulating slow with RW, supervisionA to minguardA. Pt's daughter in room present.  Back handout provided and reviewed ADL in detail. Pt educated on: set an alarm at night for medication, avoid sitting for long periods of time, correct bed positioning for sleeping, correct sequence for bed mobility, avoiding lifting more than 5 pounds and never wash directly over incision. All education is complete and patient indicates understanding. Pt does not require continued OT skilled services. OT signing off.       Follow Up Recommendations  Follow surgeon's recommendation for DC plan and follow-up therapies    Equipment Recommendations  3 in 1 bedside commode;Other (comment)(pt going to by hip kit)    Recommendations for Other Services       Precautions / Restrictions Precautions Precautions: Fall;Back Precaution Booklet Issued: Yes (comment) Precaution Comments: verbally discussed Restrictions Weight Bearing Restrictions: No      Mobility Bed Mobility               General bed mobility comments: in recliner  Transfers Overall transfer level: Needs assistance Equipment used: Rolling walker (2 wheeled) Transfers: Sit to/from Stand Sit to Stand: Min guard              Balance Overall balance assessment: Mild deficits observed, not formally tested                                         ADL either performed or assessed with clinical judgement    ADL Overall ADL's : Needs assistance/impaired Eating/Feeding: Modified independent;Sitting   Grooming: Modified independent;Sitting   Upper Body Bathing: Set up;Sitting   Lower Body Bathing: Set up;Min guard;Cueing for safety;Cueing for sequencing;With adaptive equipment;Sitting/lateral leans;Sit to/from stand   Upper Body Dressing : Set up;Sitting   Lower Body Dressing: Min guard;With adaptive equipment;Cueing for safety;Cueing for sequencing;Sitting/lateral leans;Sit to/from stand   Toilet Transfer: Min guard;Cueing for safety;Cueing for Midwife- Water quality scientist and Hygiene: Min guard;Cueing for safety;With adaptive equipment;Cueing for sequencing;Sitting/lateral lean;Sit to/from stand       Functional mobility during ADLs: Supervision/safety;Min guard;Cueing for safety;Rolling walker General ADL Comments: Pt performing LB ADL tasks with AE provided and set-upA after OT demo.     Vision Baseline Vision/History: No visual deficits Vision Assessment?: No apparent visual deficits     Perception     Praxis      Pertinent Vitals/Pain Pain Assessment: 0-10 Pain Score: 5  Pain Location: surgical site Pain Descriptors / Indicators: Discomfort;Aching Pain Intervention(s): Limited activity within patient's tolerance     Hand Dominance Right   Extremity/Trunk Assessment Upper Extremity Assessment Upper Extremity Assessment: Generalized weakness   Lower Extremity Assessment Lower Extremity Assessment: Generalized weakness   Cervical / Trunk Assessment Cervical / Trunk Assessment: Other exceptions Cervical / Trunk Exceptions: s/p back sx   Communication Communication Communication: No difficulties   Cognition Arousal/Alertness: Awake/alert Behavior During Therapy: WFL for  tasks assessed/performed Overall Cognitive Status: Within Functional Limits for tasks assessed                                     General  Comments       Exercises     Shoulder Instructions      Home Living Family/patient expects to be discharged to:: Private residence Living Arrangements: Children Available Help at Discharge: Family;Available 24 hours/day Type of Home: House Home Access: Ramped entrance     Home Layout: One level     Bathroom Shower/Tub: Teacher, early years/pre: Standard Bathroom Accessibility: Yes   Home Equipment: None          Prior Functioning/Environment Level of Independence: Independent                 OT Problem List: Decreased strength;Decreased activity tolerance;Pain      OT Treatment/Interventions:      OT Goals(Current goals can be found in the care plan section) Acute Rehab OT Goals Patient Stated Goal: go home OT Goal Formulation: With patient Time For Goal Achievement: 03/06/19 Potential to Achieve Goals: Good  OT Frequency:     Barriers to D/C:            Co-evaluation              AM-PAC OT "6 Clicks" Daily Activity     Outcome Measure Help from another person eating meals?: None Help from another person taking care of personal grooming?: None Help from another person toileting, which includes using toliet, bedpan, or urinal?: A Little Help from another person bathing (including washing, rinsing, drying)?: A Little Help from another person to put on and taking off regular upper body clothing?: None Help from another person to put on and taking off regular lower body clothing?: A Little 6 Click Score: 21   End of Session Equipment Utilized During Treatment: Gait belt;Rolling walker Nurse Communication: Mobility status  Activity Tolerance: Patient tolerated treatment well Patient left: in chair;with call bell/phone within reach;with chair alarm set;with family/visitor present  OT Visit Diagnosis: Unsteadiness on feet (R26.81);Muscle weakness (generalized) (M62.81)                Time: 8546-2703 OT Time Calculation (min): 30  min Charges:  OT General Charges $OT Visit: 1 Visit OT Evaluation $OT Eval Moderate Complexity: 1 Mod OT Treatments $Self Care/Home Management : 8-22 mins  Ebony Hail Harold Hedge) Marsa Aris OTR/L Acute Rehabilitation Services Pager: [phone] Office: [place] 02/20/2019, 4:28 PM

## 2019-02-20 NOTE — Anesthesia Postprocedure Evaluation (Signed)
Anesthesia Post Note  Patient: Audia Amick  Procedure(s) Performed: Lumbar Five Sacral One Open transforaminal lumbar interbody fusion with bilateral lumbar Five sacral Onepedicle screw placement. (N/A Spine Lumbar)     Patient location during evaluation: PACU Anesthesia Type: General Level of consciousness: awake and alert Pain management: pain level controlled Vital Signs Assessment: post-procedure vital signs reviewed and stable Respiratory status: spontaneous breathing, nonlabored ventilation, respiratory function stable and patient connected to nasal cannula oxygen Cardiovascular status: blood pressure returned to baseline and stable Postop Assessment: no apparent nausea or vomiting Anesthetic complications: no    Last Vitals:  Vitals:   02/20/19 0006 02/20/19 0428  BP: 122/70 99/66  Pulse: 92 98  Resp: 18 18  Temp: 36.9 C 36.7 C  SpO2: 100% 100%    Last Pain:  Vitals:   02/19/19 2219  TempSrc:   PainSc: 0-No pain                 Sayla Golonka

## 2019-02-24 ENCOUNTER — Encounter (HOSPITAL_COMMUNITY): Payer: Self-pay | Admitting: Neurological Surgery

## 2020-03-29 IMAGING — RF DG LUMBAR SPINE 2-3V
1 series · 2 of 2 positions shown · non-contrast
Comparison: 12/26/2018

CLINICAL DATA: L5-S1 fusion

EXAM:
LUMBAR SPINE - 2-3 VIEW; DG C-ARM 1-60 MIN

[Series 1: run · 2 of 2 slices shown]
[im 1/2]
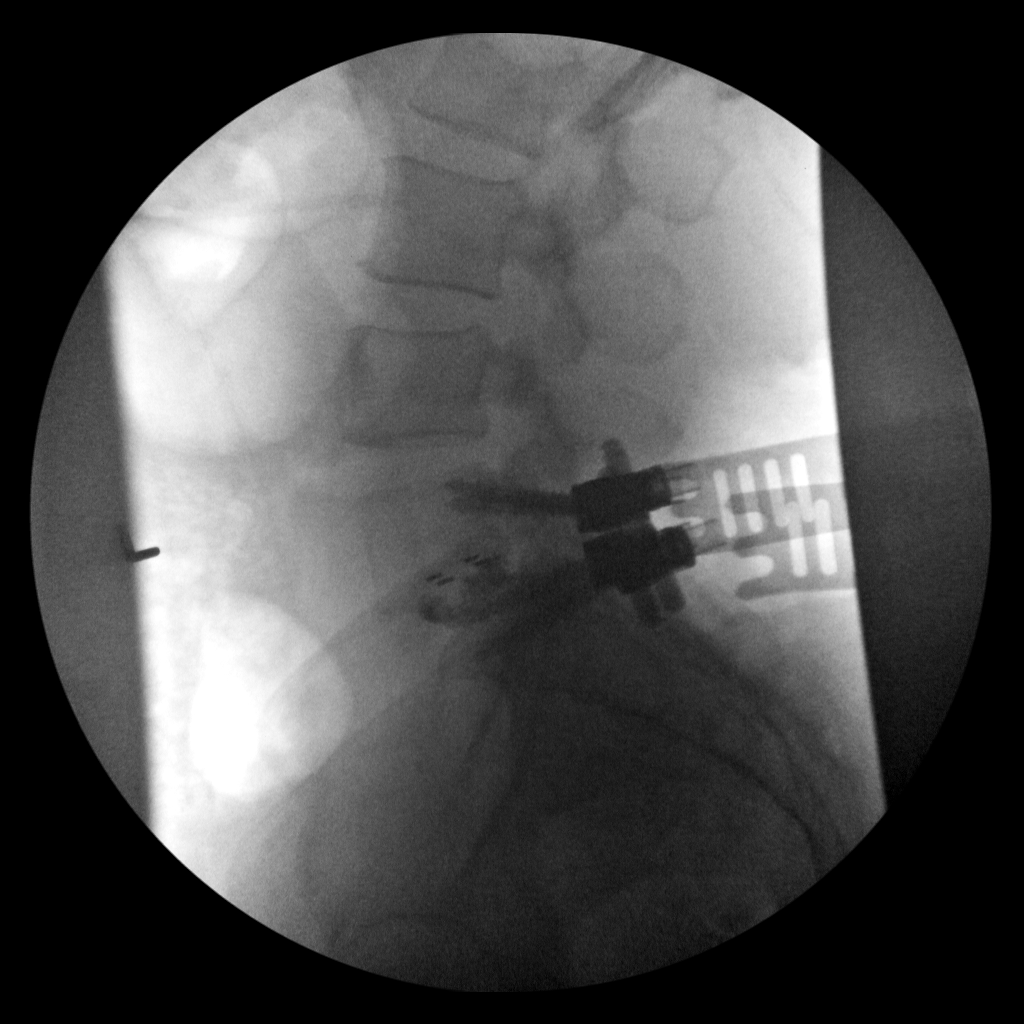
[im 2/2]
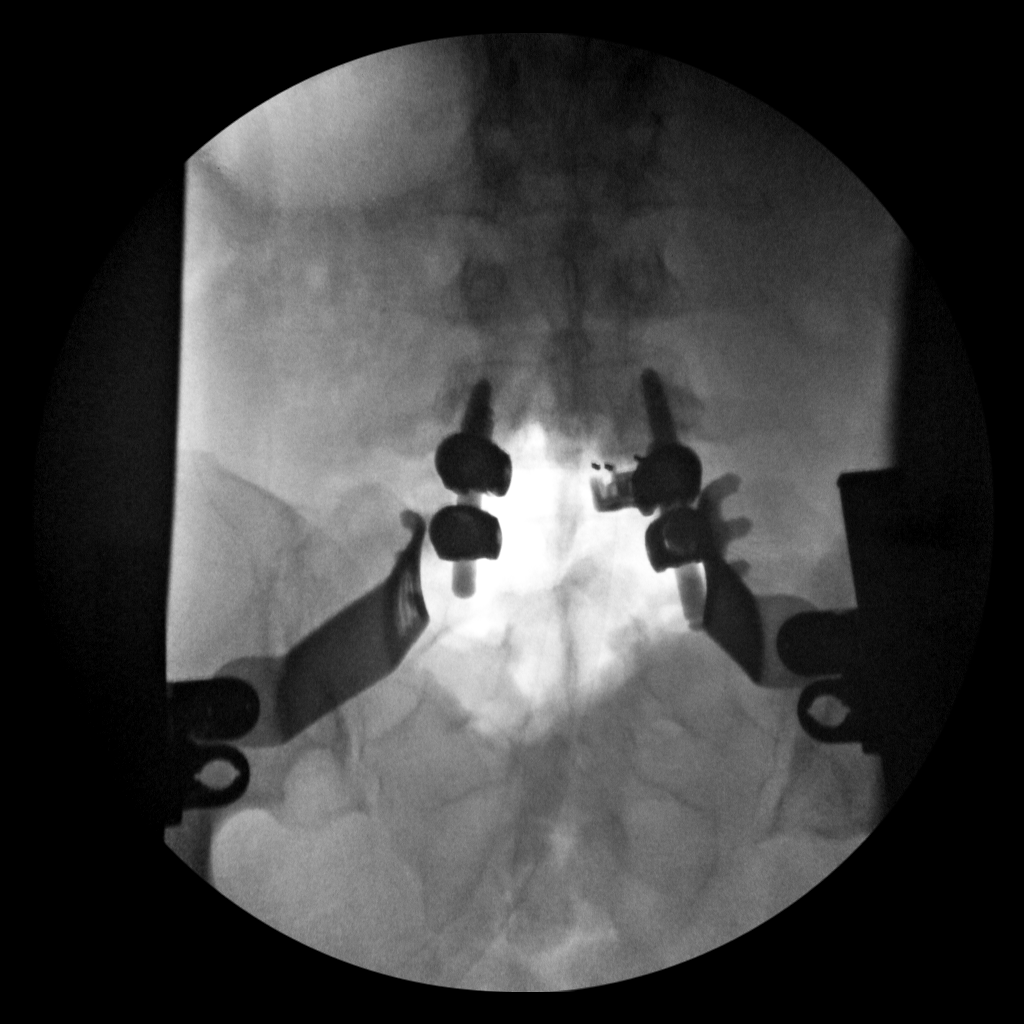

[2 of 2 positions shown; findings below may reference images not displayed]

FINDINGS: Intraoperative spot images demonstrate changes of posterior fusion
at L5-S1. No visible hardware bony complicating feature.
IMPRESSION: Posterior fusion at L5-S1.  No visible complicating feature.

## 2020-03-29 IMAGING — RF DG C-ARM 1-60 MIN
1 series · 2 of 2 positions shown · non-contrast
Comparison: 12/26/2018

CLINICAL DATA: L5-S1 fusion

EXAM:
LUMBAR SPINE - 2-3 VIEW; DG C-ARM 1-60 MIN

[Series 1: run · 2 of 2 slices shown]
[im 1/2]
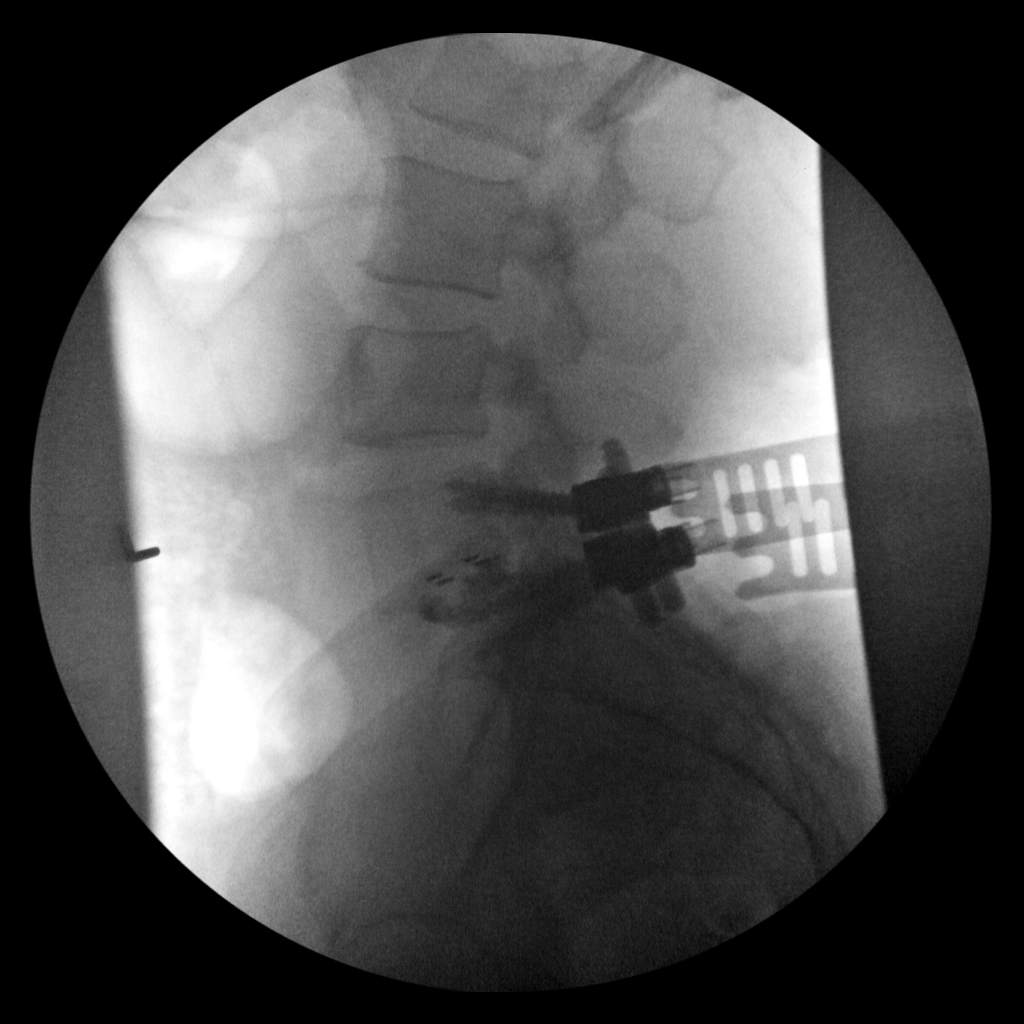
[im 2/2]
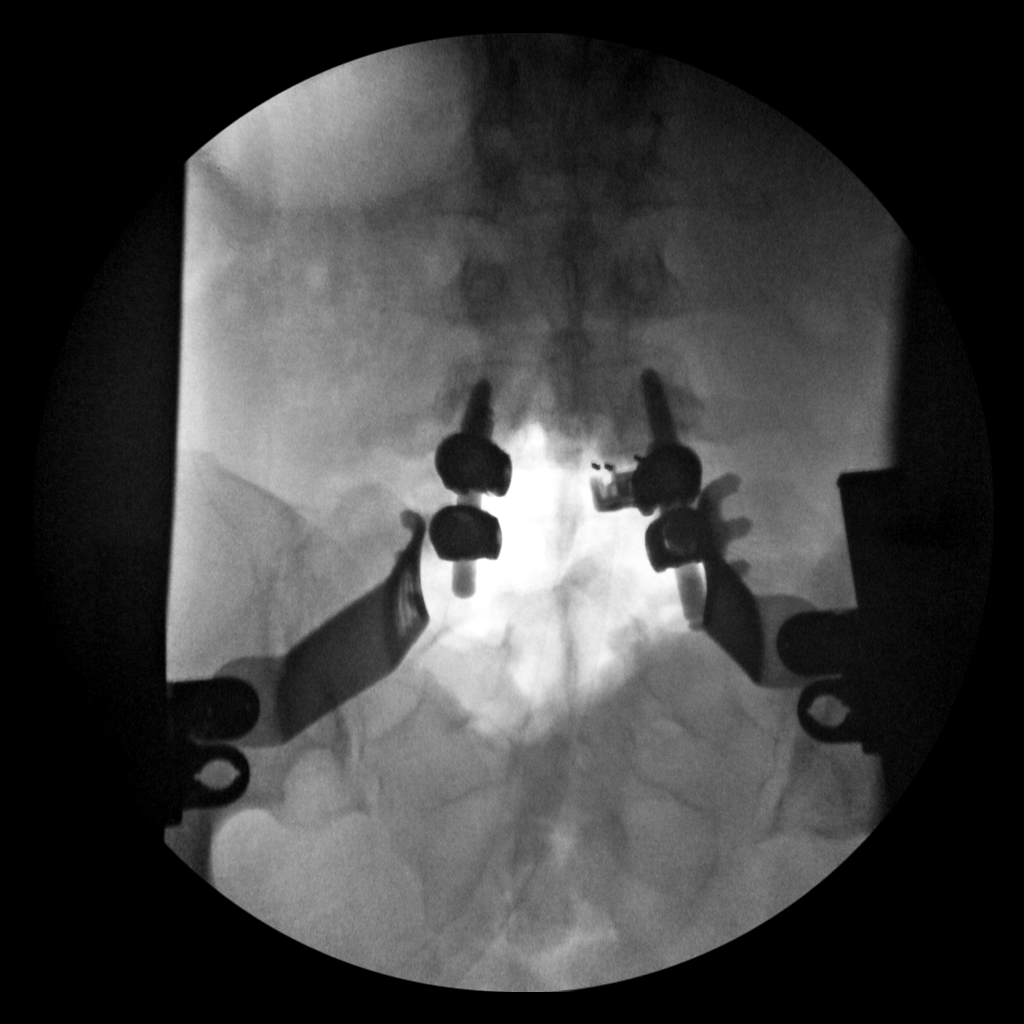

[2 of 2 positions shown; findings below may reference images not displayed]

FINDINGS: Intraoperative spot images demonstrate changes of posterior fusion
at L5-S1. No visible hardware bony complicating feature.
IMPRESSION: Posterior fusion at L5-S1.  No visible complicating feature.
